# Patient Record
Sex: Male | Born: 1980 | Race: White | Hispanic: No | Marital: Single | Smoking: Current every day smoker
Health system: Southern US, Community
[De-identification: ages and names within clinical notes are randomized; demographics above are authoritative.]

---

## 2006-06-15 ENCOUNTER — Encounter: Admission: RE | Admit: 2006-06-15 | Discharge: 2006-06-15 | Payer: Self-pay | Admitting: Occupational Medicine

## 2010-02-17 ENCOUNTER — Encounter: Admission: RE | Admit: 2010-02-17 | Discharge: 2010-02-17 | Payer: Self-pay | Admitting: Internal Medicine

## 2012-10-26 ENCOUNTER — Other Ambulatory Visit: Payer: Self-pay | Admitting: Occupational Medicine

## 2012-10-26 ENCOUNTER — Ambulatory Visit: Payer: Self-pay

## 2012-10-26 DIAGNOSIS — Z021 Encounter for pre-employment examination: Secondary | ICD-10-CM

## 2014-04-18 DIAGNOSIS — Z021 Encounter for pre-employment examination: Secondary | ICD-10-CM | POA: Insufficient documentation

## 2014-06-24 IMAGING — CR DG CHEST 2V
2 series · 2 of 2 positions shown · non-contrast
Comparison: 02/17/2010.

CLINICAL DATA: Pre employment physical exam.

CHEST - 2 VIEW

[view not recorded (1 of 2)]
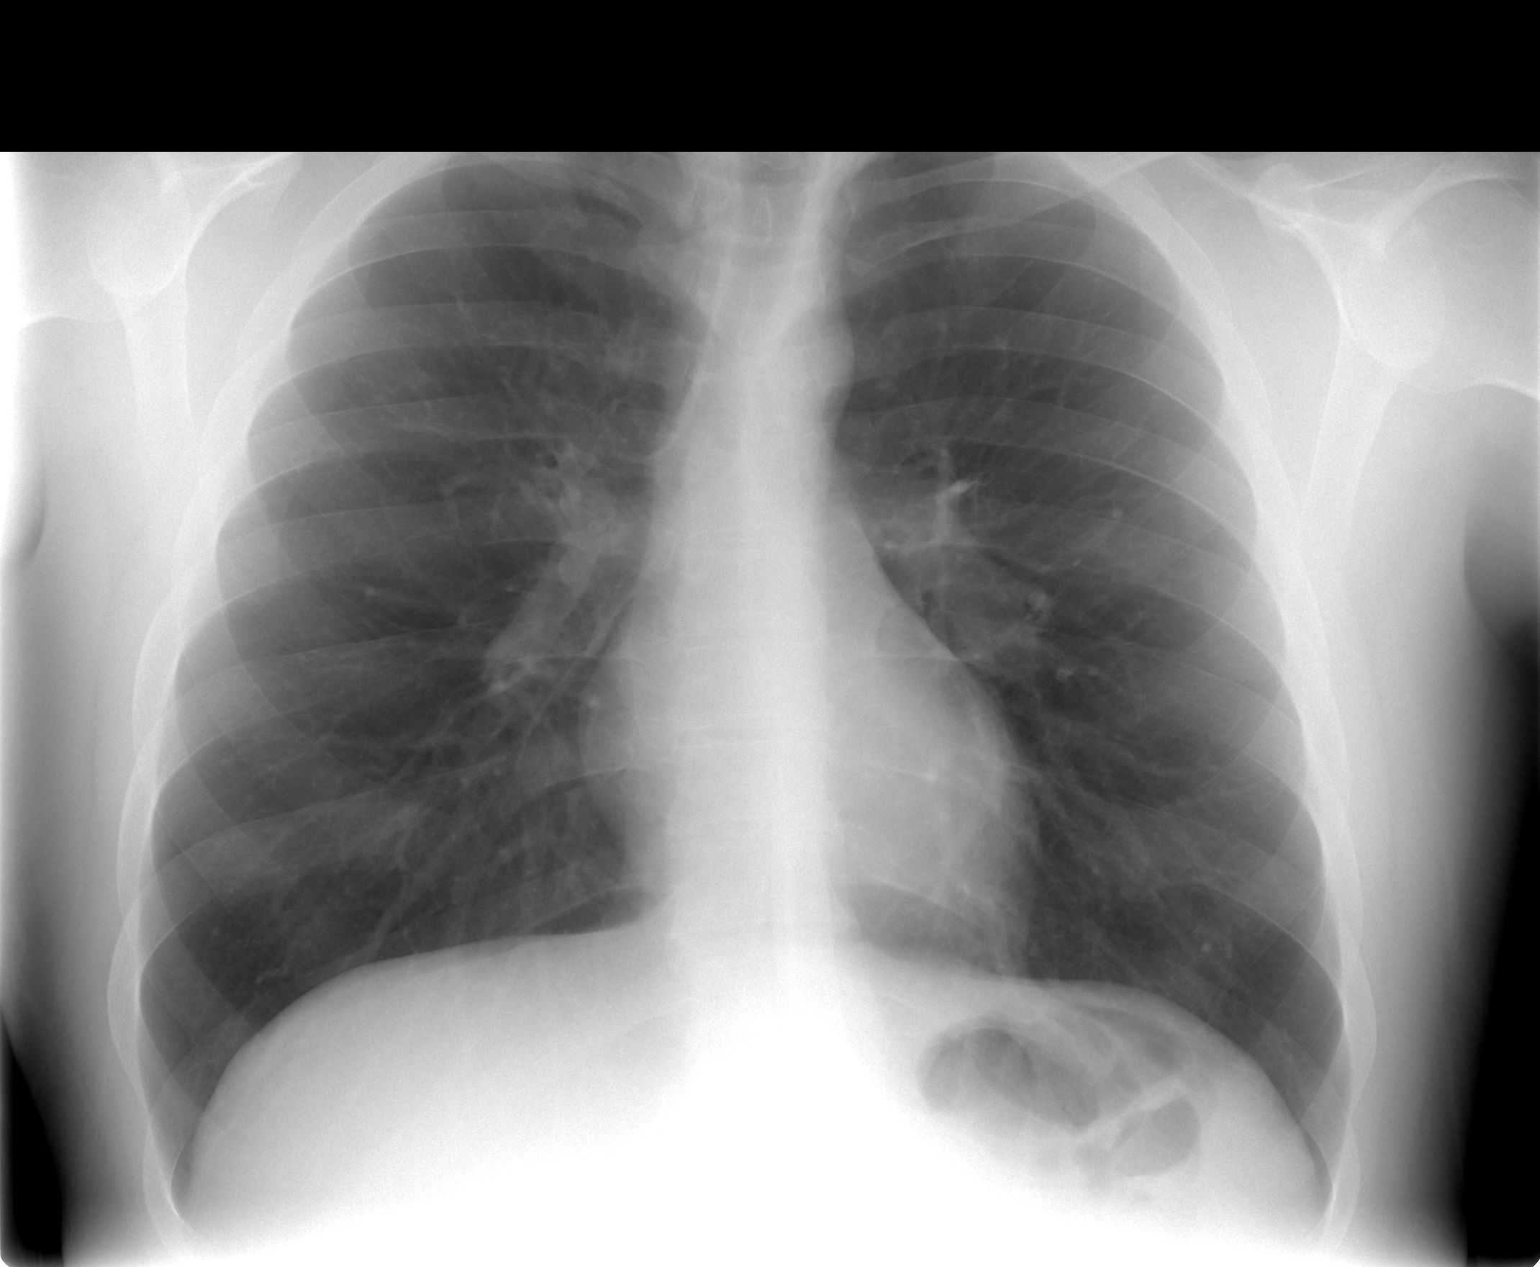

[view not recorded (2 of 2)]
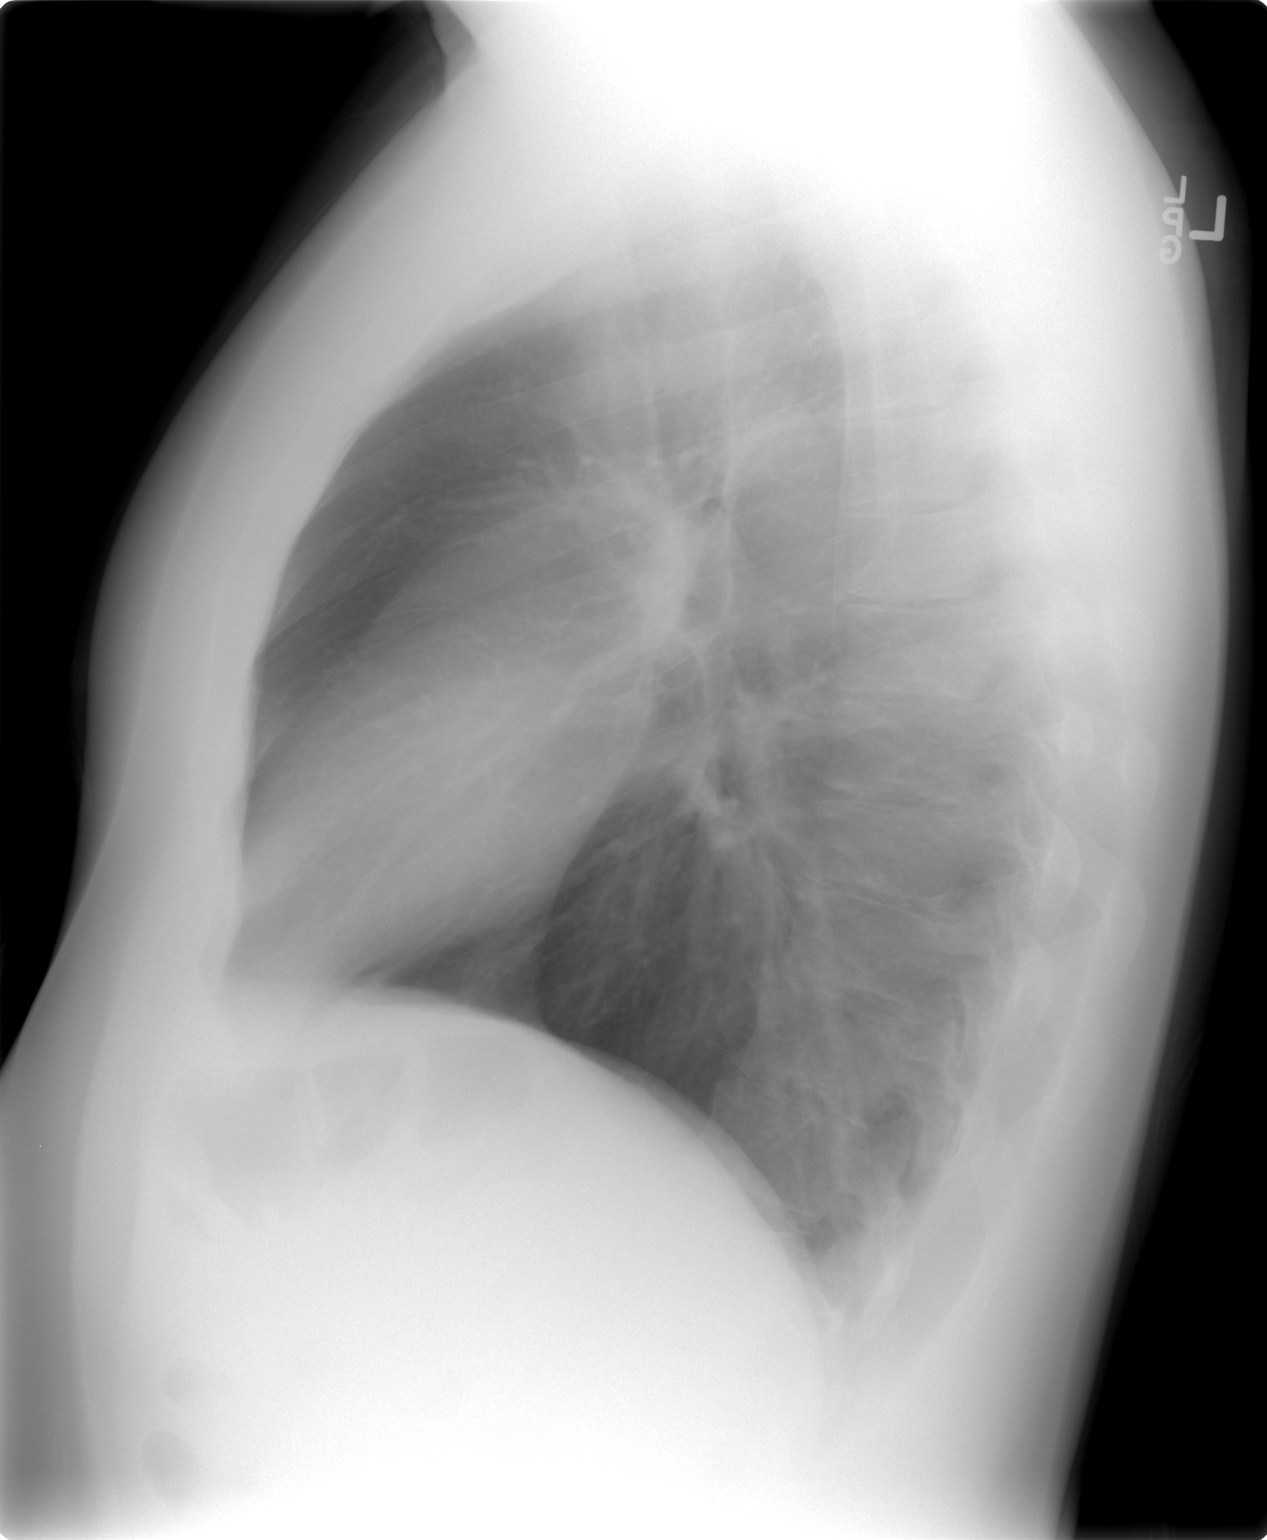

[2 of 2 positions shown; findings below may reference images not displayed]

FINDINGS: Trachea is midline.  Heart size normal.  Lungs are
hyperinflated but clear.  No pleural fluid.
IMPRESSION: Hyperinflation without acute finding.

## 2015-10-30 ENCOUNTER — Ambulatory Visit (INDEPENDENT_AMBULATORY_CARE_PROVIDER_SITE_OTHER): Payer: BLUE CROSS/BLUE SHIELD | Admitting: Family Medicine

## 2015-10-30 ENCOUNTER — Encounter: Payer: Self-pay | Admitting: Family Medicine

## 2015-10-30 VITALS — BP 129/83 | HR 75 | Temp 98.1°F | Resp 16 | Ht 68.0 in | Wt 182.4 lb

## 2015-10-30 DIAGNOSIS — F633 Trichotillomania: Secondary | ICD-10-CM

## 2015-10-30 DIAGNOSIS — F902 Attention-deficit hyperactivity disorder, combined type: Secondary | ICD-10-CM | POA: Diagnosis not present

## 2015-10-30 DIAGNOSIS — Z7689 Persons encountering health services in other specified circumstances: Secondary | ICD-10-CM

## 2015-10-30 DIAGNOSIS — Z7189 Other specified counseling: Secondary | ICD-10-CM

## 2015-10-30 MED ORDER — ALPRAZOLAM 0.5 MG PO TABS: 0.5000 mg | ORAL_TABLET | Freq: Two times a day (BID) | ORAL | Status: DC

## 2015-10-30 MED ORDER — ALPRAZOLAM 0.5 MG PO TBDP: 0.5000 mg | ORAL_TABLET | Freq: Two times a day (BID) | ORAL | Status: DC | PRN

## 2015-10-30 NOTE — Progress Notes (Signed)
Subjective:    Patient ID: Roger Davila, male    DOB: [birth year], 35 y.o.   MRN: [id number]  HPI: Roger Davila is a 35 y.o. male presenting on 10/30/2015 for Establish Care   HPI  Pt presents to establish care today. Previous care provider was .  It has been several years since His last PCP visit. Records from previous provider will be requested and reviewed. 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  Was on Xanax for 5-6 years and then tapered off. Has recently started doing it again. Saw psychiatry at [facility] as a child through early adulthood. Has anxiety issues. Was on zoloft to see if that could help- did not improve symptoms.  ADHD- taking adderall 30mg . Filled by NP at his job. Sees her once per month for adderall.   Health maintenance:  TDAP - needs updating but will do it at work.  Health maintenance labs were checked at work.    No past medical history on file. Social History   Social History  . Marital Status: Single    Spouse Name: N/A  . Number of Children: N/A  . Years of Education: N/A   Occupational History  . Not on file.   Social History Main Topics  . Smoking status: Current Every Day Smoker -- 0.25 packs/day  . Smokeless tobacco: Not on file  . Alcohol Use: No  . Drug Use: No  . Sexual Activity: Not on file   Other Topics Concern  . Not on file   Social History Narrative  . No narrative on file   Family History  Problem Relation Age of Onset  . Cancer Mother     breast cancer   No current outpatient prescriptions on file prior to visit.   No current facility-administered medications on file prior to visit.    Review of Systems  Constitutional: Negative for fever and chills.  HENT: Negative.   Respiratory: Negative for chest tightness, shortness of breath and wheezing.   Cardiovascular: Negative for chest pain, palpitations and leg swelling.  Gastrointestinal: Negative for nausea,  vomiting and abdominal pain.  Endocrine: Negative.   Genitourinary: Negative for dysuria, urgency, discharge, penile pain and testicular pain.  Musculoskeletal: Negative for back pain, joint swelling and arthralgias.  Skin: Negative.   Neurological: Negative for dizziness, weakness, numbness and headaches.  Psychiatric/Behavioral: Negative for sleep disturbance and dysphoric mood. The patient is nervous/anxious.    Per HPI unless specifically indicated above     Objective:    BP 129/83 mmHg  Pulse 75  Temp(Src) 98.1 F (36.7 C) (Oral)  Resp 16  Ht 5\' 8"  (1.727 m)  Wt 182 lb 6.4 oz (82.736 kg)  BMI 27.74 kg/m2  Wt Readings from Last 3 Encounters:  10/30/15 182 lb 6.4 oz (82.736 kg)    Physical Exam  Constitutional: He is oriented to person, place, and time. He appears well-developed and well-nourished. No distress.  HENT:  Head: Normocephalic and atraumatic.  Neck: Neck supple. No thyromegaly present.  Cardiovascular: Normal rate, regular rhythm and normal heart sounds.  Exam reveals no gallop and no friction rub.   No murmur heard. Pulmonary/Chest: Effort normal and breath sounds normal. He has no wheezes.  Abdominal: Soft. Bowel sounds are normal. He exhibits no distension. There is no tenderness. There is no rebound.  Musculoskeletal: Normal range of motion. He exhibits no edema or tenderness.  Neurological: He is alert and oriented to person,  place, and time. He has normal reflexes.  Skin: Skin is warm and dry. No rash noted. No erythema.  Bald spot on top of scalp where patient is removing hair.   Psychiatric: He has a normal mood and affect. His speech is normal and behavior is normal. Judgment and thought content normal. Cognition and memory are normal.   No results found for this or any previous visit.    Assessment & Plan:   Problem List Items Addressed This Visit      Other   Trichotillomania    Have restarted pt on Xanax today. Have discussed with patient that  he may need to see a psychiatrist for this issue.  If we are unable to get symptoms under control with current medications we will refer to psychiatry for evaluation.       Relevant Medications   ALPRAZolam (XANAX) 0.5 MG tablet   Attention deficit hyperactivity disorder (ADHD), combined type    Managed by provider at his office.        Other Visit Diagnoses    Encounter to establish care    -  Primary    Reviewed health maintenance- labs done at work.        Meds ordered this encounter  Medications  . amphetamine-dextroamphetamine (ADDERALL) 30 MG tablet    Sig: Take 30 mg by mouth 2 (two) times daily.  Marland Kitchen. DISCONTD: alprazolam Prudy Feeler(XANAX) 2 MG tablet    Sig: Take 2 mg by mouth 2 (two) times daily.  Marland Kitchen. DISCONTD: ALPRAZolam (NIRAVAM) 0.5 MG dissolvable tablet    Sig: Take 1 tablet (0.5 mg total) by mouth 2 (two) times daily as needed for anxiety.    Dispense:  60 tablet    Refill:  0    Order Specific Question:  Supervising Provider    Answer:  Janeann ForehandHAWKINS JR, JAMES H [phone][[id number]]  . ALPRAZolam (XANAX) 0.5 MG tablet    Sig: Take 1 tablet (0.5 mg total) by mouth 2 (two) times daily.    Dispense:  60 tablet    Refill:  0    Order Specific Question:  Supervising Provider    Answer:  Janeann ForehandHAWKINS JR, JAMES H [[id number]]      Follow up plan: Return in about 4 weeks (around 11/27/2015) for anxiety. .Marland Kitchen

## 2015-10-30 NOTE — Assessment & Plan Note (Signed)
Managed by provider at his office.

## 2015-10-30 NOTE — Assessment & Plan Note (Signed)
Have restarted pt on Xanax today. Have discussed with patient that he may need to see a psychiatrist for this issue.  If we are unable to get symptoms under control with current medications we will refer to psychiatry for evaluation.

## 2015-10-30 NOTE — Patient Instructions (Signed)
We will try restarting your Xanax for your disorder. However if we cannot get it under control, we will have you see a psychiatrist.

## 2015-11-26 ENCOUNTER — Encounter: Payer: Self-pay | Admitting: Family Medicine

## 2015-11-26 ENCOUNTER — Ambulatory Visit (INDEPENDENT_AMBULATORY_CARE_PROVIDER_SITE_OTHER): Payer: BLUE CROSS/BLUE SHIELD | Admitting: Family Medicine

## 2015-11-26 VITALS — BP 120/74 | HR 82 | Temp 98.1°F | Resp 16 | Ht 68.0 in | Wt 185.5 lb

## 2015-11-26 DIAGNOSIS — F633 Trichotillomania: Secondary | ICD-10-CM | POA: Diagnosis not present

## 2015-11-26 MED ORDER — ALPRAZOLAM 1 MG PO TABS: 1.0000 mg | ORAL_TABLET | Freq: Two times a day (BID) | ORAL | Status: DC | PRN

## 2015-11-26 NOTE — Patient Instructions (Signed)
Please only receive controlled medications from more than one provider.   We will check back in 1 month to see if the Xanax is preventing you from pulling your hair out.

## 2015-11-26 NOTE — Progress Notes (Signed)
Subjective:    Patient ID: Roger Davila, male    DOB: Jan 21, 1981, 35 y.o.   MRN: [id number]  HPI: Roger Davila is a 35 y.o. male presenting on 11/26/2015 for Follow-up   HPI  Pt presents for follow-up of trichotellomania. Started on Xanax 0.5mg  last visit to help with pulling hair. Pt was doing well but had to increase to  twice daily.  Has run out and now has a bald spot.  Pt work prescriber had given him a prescription for Xanax 0.25mg  dose and he filled it when he ran out of medication from this office. Fills have been confirmed with the pharmacy and [place] CSRS.   History reviewed. No pertinent past medical history.  Current Outpatient Prescriptions on File Prior to Visit  Medication Sig  . amphetamine-dextroamphetamine (ADDERALL) 30 MG tablet Take 30 mg by mouth 2 (two) times daily.   No current facility-administered medications on file prior to visit.    Review of Systems  Constitutional: Negative for fever and chills.  HENT: Negative.   Respiratory: Negative for chest tightness, shortness of breath and wheezing.   Cardiovascular: Negative for chest pain, palpitations and leg swelling.  Gastrointestinal: Negative for nausea, vomiting and abdominal pain.  Endocrine: Negative.   Genitourinary: Negative for dysuria, urgency, discharge, penile pain and testicular pain.  Musculoskeletal: Negative for back pain, joint swelling and arthralgias.  Skin: Negative.   Neurological: Negative for dizziness, weakness, numbness and headaches.  Psychiatric/Behavioral: Positive for behavioral problems and agitation. Negative for sleep disturbance and dysphoric mood.       Pulling hair    Per HPI unless specifically indicated above     Objective:    BP 120/74 mmHg  Pulse 82  Temp(Src) 98.1 F (36.7 C)  Resp 16  Ht  (1.727 m)  Wt 185 lb 8 oz (84.142 kg)  BMI 28.21 kg/m2  Wt Readings from Last 3 Encounters:  11/26/15 185 lb 8 oz (84.142 kg)  10/30/15 182 lb 6.4 oz  (82.736 kg)    Physical Exam  Constitutional: He is oriented to person, place, and time. He appears well-developed and well-nourished. No distress.  HENT:  Head: Normocephalic and atraumatic.  Neck: Neck supple. No thyromegaly present.  Cardiovascular: Normal rate, regular rhythm and normal heart sounds.  Exam reveals no gallop and no friction rub.   No murmur heard. Pulmonary/Chest: Effort normal and breath sounds normal. He has no wheezes.  Abdominal: Soft. Bowel sounds are normal. He exhibits no distension. There is no tenderness. There is no rebound.  Musculoskeletal: Normal range of motion. He exhibits no edema or tenderness.  Neurological: He is alert and oriented to person, place, and time. He has normal reflexes.  Skin: Skin is warm and dry. No rash noted. No erythema.  2cm bald spot on top of scalp.   Psychiatric: His speech is normal and behavior is normal. Judgment and thought content normal. His mood appears anxious. Cognition and memory are normal.   No results found for this or any previous visit.    Assessment & Plan:   Problem List Items Addressed This Visit      Other   Trichotillomania - Primary    Renewed Xanax today, increased dose to  daily. If dose increase is required, he must see psychiatry.  Have discussed in depth with patient how he cannot get Xanax from more than 1 provider and he cannot fill at multiple locations. We check CSRS database at every visit. He will  no longer get controlled medications from this office if it happens again.       Relevant Medications   ALPRAZolam (XANAX) 1 MG tablet      Meds ordered this encounter  Medications  . ALPRAZolam (XANAX) 1 MG tablet    Sig: Take 1 tablet (1 mg total) by mouth 2 (two) times daily as needed for anxiety.    Dispense:  60 tablet    Refill:  0    Order Specific Question:  Supervising Provider    Answer:  Janeann Forehand [[id number]]      Follow up plan: Return in about 4 weeks (around  12/24/2015) for anxiety. Marland Kitchen

## 2015-11-26 NOTE — Assessment & Plan Note (Addendum)
Renewed Xanax today, increased dose to  daily. If dose increase is required, he must see psychiatry.  Have discussed in depth with patient how he cannot get Xanax from more than 1 provider and he cannot fill at multiple locations. We check CSRS database at every visit. He will no longer get controlled medications from this office if it happens again.

## 2015-11-28 ENCOUNTER — Ambulatory Visit: Payer: BLUE CROSS/BLUE SHIELD | Admitting: Family Medicine

## 2015-12-25 ENCOUNTER — Encounter: Payer: Self-pay | Admitting: Family Medicine

## 2015-12-25 ENCOUNTER — Ambulatory Visit: Payer: BLUE CROSS/BLUE SHIELD | Admitting: Family Medicine

## 2015-12-27 ENCOUNTER — Telehealth: Payer: Self-pay | Admitting: Family Medicine

## 2015-12-27 ENCOUNTER — Ambulatory Visit (INDEPENDENT_AMBULATORY_CARE_PROVIDER_SITE_OTHER): Payer: BLUE CROSS/BLUE SHIELD | Admitting: Family Medicine

## 2015-12-27 ENCOUNTER — Ambulatory Visit: Payer: BLUE CROSS/BLUE SHIELD | Admitting: Family Medicine

## 2015-12-27 VITALS — BP 135/75 | HR 67 | Temp 98.4°F | Resp 16 | Ht 68.0 in | Wt 185.0 lb

## 2015-12-27 DIAGNOSIS — F633 Trichotillomania: Secondary | ICD-10-CM | POA: Diagnosis not present

## 2015-12-27 MED ORDER — ALPRAZOLAM 1 MG PO TABS: 1.0000 mg | ORAL_TABLET | Freq: Two times a day (BID) | ORAL | Status: DC | PRN

## 2015-12-27 NOTE — Assessment & Plan Note (Signed)
Renewed Xanax 1mg  BID today. Pt reports good efficacy. GAD7 is 14. Pt feels his anxiety is better controlled than it was.  Controlled substance agreement signed.  Plan for random urine drug screen next month.  Recheck 1 mos.

## 2015-12-27 NOTE — Telephone Encounter (Signed)
As per Amy pt can be here today at 1:15 pm or Monday  9:45 am Fleet ContrasRachel is calling pt to schedule appointment ?

## 2015-12-27 NOTE — Patient Instructions (Signed)
We will continue your Xanax for the hair pulling. Please take twice daily.

## 2015-12-27 NOTE — Progress Notes (Signed)
Subjective:    Patient ID: CHIDERA THIVIERGE, male    DOB: 18-May-1981, 35 y.o.   MRN: [id number]  HPI: Roger Davila is a 35 y.o. male presenting on 12/27/2015 for Anxiety   HPI  Pt presents for follow-up of trichotellomania. He is taking Xanax to help with hair pulling. Taking  twice daily. Has stopped pulling hair. Bald spot has gotten smaller. Feeling as though medication is controlling his symptoms.  Controlled substance contract signed. Pt medication fills have been verified per [place] CSRS.  Pt is still taking ADHD as prescribed by Dr. Juel Burrow at his office. Pt is aware if symptoms do not remain controlled with  He will be asked to see psychiatry.    No past medical history on file.  Current Outpatient Prescriptions on File Prior to Visit  Medication Sig  . amphetamine-dextroamphetamine (ADDERALL) 30 MG tablet Take 30 mg by mouth 2 (two) times daily.   No current facility-administered medications on file prior to visit.    Review of Systems  Constitutional: Negative for fever and chills.  HENT: Negative.   Respiratory: Negative for chest tightness, shortness of breath and wheezing.   Cardiovascular: Negative for chest pain, palpitations and leg swelling.  Gastrointestinal: Negative for nausea, vomiting and abdominal pain.  Endocrine: Negative.   Genitourinary: Negative for dysuria, urgency, discharge, penile pain and testicular pain.  Musculoskeletal: Negative for back pain, joint swelling and arthralgias.  Skin: Negative.   Neurological: Negative for dizziness, weakness, numbness and headaches.  Psychiatric/Behavioral: Negative for sleep disturbance and dysphoric mood. The patient is nervous/anxious (improving.).    Per HPI unless specifically indicated above     Objective:    BP 135/75 mmHg  Pulse 67  Temp(Src) 98.4 F (36.9 C) (Oral)  Resp 16  Ht  (1.727 m)  Wt 185 lb (83.915 kg)  BMI 28.14 kg/m2  Wt Readings from Last 3 Encounters:  12/27/15 185 lb  (83.915 kg)  11/26/15 185 lb 8 oz (84.142 kg)  10/30/15 182 lb 6.4 oz (82.736 kg)    Physical Exam  Constitutional: He is oriented to person, place, and time. He appears well-developed and well-nourished. No distress.  HENT:  Head: Normocephalic and atraumatic.  Neck: Neck supple. No thyromegaly present.  Cardiovascular: Normal rate, regular rhythm and normal heart sounds.  Exam reveals no gallop and no friction rub.   No murmur heard. Pulmonary/Chest: Effort normal and breath sounds normal. He has no wheezes.  Abdominal: Soft. Bowel sounds are normal. He exhibits no distension. There is no tenderness. There is no rebound.  Musculoskeletal: Normal range of motion. He exhibits no edema or tenderness.  Neurological: He is alert and oriented to person, place, and time. He has normal reflexes.  Skin: Skin is warm and dry. No rash noted. No erythema.  Small bald spot at top of the head.    Psychiatric: He has a normal mood and affect. His speech is normal and behavior is normal. Judgment and thought content normal. Cognition and memory are normal.   No results found for this or any previous visit.    Assessment & Plan:   Problem List Items Addressed This Visit      Other   Trichotillomania - Primary    Renewed Xanax  BID today. Pt reports good efficacy. GAD7 is 14. Pt feels his anxiety is better controlled than it was.  Controlled substance agreement signed.  Plan for random urine drug screen next month.  Recheck 1 mos.  Relevant Medications   ALPRAZolam (XANAX) 1 MG tablet      Meds ordered this encounter  Medications  . ALPRAZolam (XANAX) 1 MG tablet    Sig: Take 1 tablet (1 mg total) by mouth 2 (two) times daily as needed for anxiety.    Dispense:  60 tablet    Refill:  0    Order Specific Question:  Supervising Provider    Answer:  Janeann ForehandHAWKINS JR, JAMES H [[id number]]      Follow up plan: Return in about 4 weeks (around 01/24/2016).

## 2015-12-27 NOTE — Telephone Encounter (Signed)
Pt needs a refill on [facility]razolam sent to [facility].  His call back number is [phone]

## 2016-01-28 ENCOUNTER — Ambulatory Visit (INDEPENDENT_AMBULATORY_CARE_PROVIDER_SITE_OTHER): Payer: BLUE CROSS/BLUE SHIELD | Admitting: Family Medicine

## 2016-01-28 VITALS — BP 149/76 | HR 64 | Temp 98.1°F | Resp 16 | Ht 68.0 in | Wt 179.0 lb

## 2016-01-28 DIAGNOSIS — Z0289 Encounter for other administrative examinations: Secondary | ICD-10-CM | POA: Diagnosis not present

## 2016-01-28 DIAGNOSIS — F633 Trichotillomania: Secondary | ICD-10-CM | POA: Diagnosis not present

## 2016-01-28 DIAGNOSIS — Z79899 Other long term (current) drug therapy: Secondary | ICD-10-CM

## 2016-01-28 MED ORDER — ALPRAZOLAM 1 MG PO TABS: 1.0000 mg | ORAL_TABLET | Freq: Two times a day (BID) | ORAL | Status: AC | PRN

## 2016-01-28 NOTE — Progress Notes (Signed)
Subjective:    Patient ID: Roger Davila, male    DOB: Dec 06, 1980, 35 y.o.   MRN: [id number]  HPI: Roger Davila is a 35 y.o. male presenting on 01/28/2016 for trichotillomania   HPI  Pt presents for follow-up of trichotillomania. Pt has had a stressful month due to car issues. Has started pulling hair more. Has a few bald spots- small on the top of the head. Pt is open to psychiatry referral today. Feels his anxiety is worse due to stress of past month. Has seen psychologists in the past years.    No past medical history on file.  Current Outpatient Prescriptions on File Prior to Visit  Medication Sig  . amphetamine-dextroamphetamine (ADDERALL) 30 MG tablet Take 30 mg by mouth 2 (two) times daily.   No current facility-administered medications on file prior to visit.    Review of Systems  Constitutional: Negative for fever and chills.  HENT: Negative.   Respiratory: Negative for chest tightness, shortness of breath and wheezing.   Cardiovascular: Negative for chest pain, palpitations and leg swelling.  Gastrointestinal: Negative for nausea, vomiting and abdominal pain.  Endocrine: Negative.   Genitourinary: Negative for dysuria, urgency, discharge, penile pain and testicular pain.  Musculoskeletal: Negative for back pain, joint swelling and arthralgias.  Skin: Negative.   Neurological: Negative for dizziness, weakness, numbness and headaches.  Psychiatric/Behavioral: Negative for sleep disturbance and dysphoric mood. The patient is nervous/anxious and is hyperactive.    Per HPI unless specifically indicated above     Objective:    BP 149/76 mmHg  Pulse 64  Temp(Src) 98.1 F (36.7 C) (Oral)  Resp 16  Ht 5\' 8"  (1.727 m)  Wt 179 lb (81.194 kg)  BMI 27.22 kg/m2  Wt Readings from Last 3 Encounters:  01/28/16 179 lb (81.194 kg)  12/27/15 185 lb (83.915 kg)  11/26/15 185 lb 8 oz (84.142 kg)    Physical Exam  Constitutional: He is oriented to person, place, and  time. He appears well-developed and well-nourished. No distress.  HENT:  Head: Normocephalic and atraumatic.  Neck: Neck supple. No thyromegaly present.  Cardiovascular: Normal rate, regular rhythm and normal heart sounds.  Exam reveals no gallop and no friction rub.   No murmur heard. Pulmonary/Chest: Effort normal and breath sounds normal. He has no wheezes.  Abdominal: Soft. Bowel sounds are normal. He exhibits no distension. There is no tenderness. There is no rebound.  Musculoskeletal: Normal range of motion. He exhibits no edema or tenderness.  Neurological: He is alert and oriented to person, place, and time. He has normal reflexes.  Skin: Skin is warm and dry. No rash noted. No erythema.  Multiple small bald spots on the top of the head.   Psychiatric: His speech is normal and behavior is normal. Judgment and thought content normal. His mood appears anxious. Cognition and memory are normal.   No results found for this or any previous visit.    Assessment & Plan:   Problem List Items Addressed This Visit      Other   Trichotillomania - Primary    Referral to psychiatry placed today due to increased anxiety and inability to achieve remission of symptoms. Pt is amenable to referral today. Renewed Xanax x 1 mos.  Fill date per [place] CSRS 3/10.       Relevant Medications   ALPRAZolam (XANAX) 1 MG tablet   Other Relevant Orders   Ambulatory referral to Psychiatry   ToxASSURE Select 13 (MW), Urine  Other Visit Diagnoses    Benzodiazepine contract exists        Random urine drug screen completed today.     Relevant Orders    ToxASSURE Select 13 (MW), Urine       Meds ordered this encounter  Medications  . ALPRAZolam (XANAX) 1 MG tablet    Sig: Take 1 tablet (1 mg total) by mouth 2 (two) times daily as needed for anxiety.    Dispense:  60 tablet    Refill:  0    Order Specific Question:  Supervising Provider    Answer:  Janeann Forehand [[id number]]      Follow up  plan: Return in about 4 weeks (around 02/25/2016) for Anxiety. Marland Kitchen

## 2016-01-28 NOTE — Assessment & Plan Note (Signed)
Referral to psychiatry placed today due to increased anxiety and inability to achieve remission of symptoms. Pt is amenable to referral today. Renewed Xanax x 1 mos.  Fill date per [employer] CSRS 3/10.

## 2016-01-28 NOTE — Patient Instructions (Signed)
We will start a referral to psychiatry today. [facility] They will give you a call.

## 2016-02-02 LAB — TOXASSURE SELECT 13 (MW), URINE: PDF: 0

## 2016-02-10 ENCOUNTER — Telehealth: Payer: Self-pay | Admitting: Family Medicine

## 2016-02-10 NOTE — Telephone Encounter (Signed)
Called to review urine drug screen results. LMTCB.

## 2016-02-24 NOTE — Telephone Encounter (Signed)
Called LMTCB x2- Pt never returned call. Closing this encounter.

## 2016-02-25 ENCOUNTER — Ambulatory Visit (INDEPENDENT_AMBULATORY_CARE_PROVIDER_SITE_OTHER): Payer: BLUE CROSS/BLUE SHIELD | Admitting: Family Medicine

## 2016-02-25 ENCOUNTER — Encounter: Payer: Self-pay | Admitting: Family Medicine

## 2016-02-25 VITALS — BP 133/72 | HR 79 | Temp 98.4°F | Resp 16 | Ht 68.0 in | Wt 190.4 lb

## 2016-02-25 DIAGNOSIS — F633 Trichotillomania: Secondary | ICD-10-CM

## 2016-02-25 NOTE — Assessment & Plan Note (Signed)
Due to violation of controlled substance contract, no Xanax will be prescribed today. Pt was given information to contact local psychiatric facilities to seek treatment.

## 2016-02-25 NOTE — Progress Notes (Signed)
Subjective:    Patient ID: Roger JenkinsJoshua N Schiff, male    DOB: Mar 01, 1981, 35 y.o.   MRN: [id number]  HPI: Roger JenkinsJoshua N Davila is a 35 y.o. male presenting on 02/25/2016 for trichotillomania   HPI  Pt presents for trichotillomania- was referred to psychiatry the appts available clashed with his schedule. He has not contacted them back. Random urine drug screen was done last visit- urine was positive for valium and buprenorphine, in addition to expected findings of adderall and Xanax. Pt reports he had been taking his mother's valium.  Provider was not informed of this at the visit. As this is a violation of controlled substance contract, no Xanax will be given today.   Pt does report that his anxiety is doing much better and he has stopped pulling hair.  He is still amenable to seeing psychiatry at this time.    No past medical history on file.  Current Outpatient Prescriptions on File Prior to Visit  Medication Sig  . ALPRAZolam (XANAX) 1 MG tablet Take 1 tablet (1 mg total) by mouth 2 (two) times daily as needed for anxiety.  Marland Kitchen. amphetamine-dextroamphetamine (ADDERALL) 30 MG tablet Take 30 mg by mouth 2 (two) times daily.   No current facility-administered medications on file prior to visit.    Review of Systems  Constitutional: Negative for fever and chills.  HENT: Negative.   Respiratory: Negative for chest tightness, shortness of breath and wheezing.   Cardiovascular: Negative for chest pain, palpitations and leg swelling.  Gastrointestinal: Negative for nausea, vomiting and abdominal pain.  Endocrine: Negative.   Genitourinary: Negative for dysuria, urgency, discharge, penile pain and testicular pain.  Musculoskeletal: Negative for back pain, joint swelling and arthralgias.  Skin: Negative.   Neurological: Negative for dizziness, weakness, numbness and headaches.  Psychiatric/Behavioral: Negative for sleep disturbance and dysphoric mood.   Per HPI unless specifically indicated  above     Objective:    BP 133/72 mmHg  Pulse 79  Temp(Src) 98.4 F (36.9 C) (Oral)  Resp 16  Ht 5\' 8"  (1.727 m)  Wt 190 lb 6.4 oz (86.365 kg)  BMI 28.96 kg/m2  Wt Readings from Last 3 Encounters:  02/25/16 190 lb 6.4 oz (86.365 kg)  01/28/16 179 lb (81.194 kg)  12/27/15 185 lb (83.915 kg)    Physical Exam  Constitutional: He is oriented to person, place, and time. He appears well-developed and well-nourished. No distress.  HENT:  Head: Normocephalic and atraumatic.  Musculoskeletal: Normal range of motion.  Neurological: He is alert and oriented to person, place, and time.  Skin: Skin is warm and dry. He is not diaphoretic.  Psychiatric: He has a normal mood and affect. His speech is normal and behavior is normal. Judgment and thought content normal. Cognition and memory are normal.   Results for orders placed or performed in visit on 01/28/16  ToxASSURE Select 13 (MW), Urine  Result Value Ref Range   Report Summary FINAL    PDF .       Assessment & Plan:   Problem List Items Addressed This Visit      Other   Trichotillomania - Primary    Due to violation of controlled substance contract, no Xanax will be prescribed today. Pt was given information to contact local psychiatric facilities to seek treatment.          No orders of the defined types were placed in this encounter.      Follow up plan: Return if symptoms worsen  or fail to improve.

## 2016-02-25 NOTE — Patient Instructions (Signed)
[facility]:  [phone]  Address: [address], [place], [place] [address]  Phone: [phone]  [facility]: Psychiatry Address: [address], [place], [place] [address]  Phone: [phone]

## 2016-03-11 ENCOUNTER — Telehealth: Payer: Self-pay | Admitting: Family Medicine

## 2016-03-11 NOTE — Telephone Encounter (Signed)
Called [facility] and added new codes to have drug testing paid for.  F15.20 and F19.20

## 2016-08-05 ENCOUNTER — Encounter (INDEPENDENT_AMBULATORY_CARE_PROVIDER_SITE_OTHER): Payer: Self-pay

## 2016-08-19 ENCOUNTER — Ambulatory Visit: Payer: Self-pay | Admitting: Unknown Physician Specialty
